# Patient Record
Sex: Male | Born: 1965 | Race: White | Hispanic: No | Marital: Married | Smoking: Former smoker
Health system: Southern US, Community
[De-identification: ages and names within clinical notes are randomized; demographics above are authoritative.]

## PROBLEM LIST (undated history)

## (undated) DIAGNOSIS — E119 Type 2 diabetes mellitus without complications: Secondary | ICD-10-CM

## (undated) DIAGNOSIS — E785 Hyperlipidemia, unspecified: Secondary | ICD-10-CM

## (undated) DIAGNOSIS — G4700 Insomnia, unspecified: Secondary | ICD-10-CM

## (undated) DIAGNOSIS — N200 Calculus of kidney: Secondary | ICD-10-CM

## (undated) DIAGNOSIS — F329 Major depressive disorder, single episode, unspecified: Secondary | ICD-10-CM

## (undated) DIAGNOSIS — T7840XA Allergy, unspecified, initial encounter: Secondary | ICD-10-CM

## (undated) HISTORY — DX: Allergy, unspecified, initial encounter: T78.40XA

## (undated) HISTORY — DX: Major depressive disorder, single episode, unspecified: F32.9

## (undated) HISTORY — DX: Hyperlipidemia, unspecified: E78.5

## (undated) HISTORY — DX: Insomnia, unspecified: G47.00

## (undated) HISTORY — DX: Type 2 diabetes mellitus without complications: E11.9

## (undated) HISTORY — PX: HERNIA REPAIR: SHX51

## (undated) HISTORY — PX: CHOLECYSTECTOMY: SHX55

## (undated) HISTORY — DX: Calculus of kidney: N20.0

---

## 1997-11-21 ENCOUNTER — Ambulatory Visit (HOSPITAL_COMMUNITY): Admission: RE | Admit: 1997-11-21 | Discharge: 1997-11-21 | Payer: Self-pay | Admitting: Surgery

## 1998-01-13 ENCOUNTER — Emergency Department (HOSPITAL_COMMUNITY): Admission: EM | Admit: 1998-01-13 | Discharge: 1998-01-13 | Payer: Self-pay | Admitting: Emergency Medicine

## 1998-01-13 ENCOUNTER — Encounter: Payer: Self-pay | Admitting: Emergency Medicine

## 1998-01-14 ENCOUNTER — Encounter: Payer: Self-pay | Admitting: Emergency Medicine

## 1998-01-14 ENCOUNTER — Ambulatory Visit (HOSPITAL_COMMUNITY): Admission: RE | Admit: 1998-01-14 | Discharge: 1998-01-14 | Payer: Self-pay | Admitting: Emergency Medicine

## 1998-01-16 ENCOUNTER — Observation Stay (HOSPITAL_COMMUNITY): Admission: RE | Admit: 1998-01-16 | Discharge: 1998-01-17 | Payer: Self-pay

## 1999-11-05 ENCOUNTER — Ambulatory Visit (HOSPITAL_COMMUNITY): Admission: RE | Admit: 1999-11-05 | Discharge: 1999-11-05 | Payer: Self-pay | Admitting: *Deleted

## 1999-11-13 ENCOUNTER — Encounter: Payer: Self-pay | Admitting: Urology

## 1999-11-13 ENCOUNTER — Ambulatory Visit (HOSPITAL_COMMUNITY): Admission: RE | Admit: 1999-11-13 | Discharge: 1999-11-13 | Payer: Self-pay | Admitting: Urology

## 1999-11-24 ENCOUNTER — Encounter: Payer: Self-pay | Admitting: Urology

## 1999-11-24 ENCOUNTER — Ambulatory Visit (HOSPITAL_COMMUNITY): Admission: RE | Admit: 1999-11-24 | Discharge: 1999-11-24 | Payer: Self-pay | Admitting: Urology

## 2005-12-04 ENCOUNTER — Emergency Department (HOSPITAL_COMMUNITY): Admission: EM | Admit: 2005-12-04 | Discharge: 2005-12-05 | Payer: Self-pay | Admitting: Emergency Medicine

## 2007-01-06 DIAGNOSIS — F32A Depression, unspecified: Secondary | ICD-10-CM

## 2007-01-06 HISTORY — DX: Depression, unspecified: F32.A

## 2009-07-04 ENCOUNTER — Ambulatory Visit (HOSPITAL_COMMUNITY): Admission: RE | Admit: 2009-07-04 | Discharge: 2009-07-04 | Payer: Self-pay | Admitting: Urology

## 2009-12-12 ENCOUNTER — Emergency Department (HOSPITAL_COMMUNITY): Admission: EM | Admit: 2009-12-12 | Discharge: 2009-06-30 | Payer: Self-pay | Admitting: Emergency Medicine

## 2010-03-23 LAB — URINALYSIS, ROUTINE W REFLEX MICROSCOPIC
Bilirubin Urine: NEGATIVE
Glucose, UA: NEGATIVE mg/dL
Ketones, ur: NEGATIVE mg/dL
Urobilinogen, UA: 0.2 mg/dL (ref 0.0–1.0)

## 2010-03-23 LAB — URINE MICROSCOPIC-ADD ON

## 2010-03-23 LAB — URINE CULTURE: Culture: NO GROWTH

## 2010-03-23 LAB — GLUCOSE, CAPILLARY: Glucose-Capillary: 108 mg/dL — ABNORMAL HIGH (ref 70–99)

## 2011-02-27 ENCOUNTER — Other Ambulatory Visit: Payer: Self-pay | Admitting: Family Medicine

## 2011-03-09 ENCOUNTER — Other Ambulatory Visit: Payer: Self-pay | Admitting: Internal Medicine

## 2011-03-09 MED ORDER — TEMAZEPAM 15 MG PO CAPS: 15.0000 mg | ORAL_CAPSULE | Freq: Every evening | ORAL | Status: DC | PRN

## 2011-03-10 ENCOUNTER — Other Ambulatory Visit: Payer: Self-pay

## 2011-03-10 MED ORDER — TEMAZEPAM 15 MG PO CAPS: 15.0000 mg | ORAL_CAPSULE | Freq: Every evening | ORAL | Status: DC | PRN

## 2011-03-18 ENCOUNTER — Other Ambulatory Visit: Payer: Self-pay | Admitting: Physician Assistant

## 2011-03-19 ENCOUNTER — Other Ambulatory Visit: Payer: Self-pay

## 2011-03-30 ENCOUNTER — Encounter: Payer: Self-pay | Admitting: Family Medicine

## 2011-03-30 ENCOUNTER — Ambulatory Visit (INDEPENDENT_AMBULATORY_CARE_PROVIDER_SITE_OTHER): Payer: 59 | Admitting: Family Medicine

## 2011-03-30 VITALS — BP 126/71 | HR 86 | Temp 97.7°F | Resp 16 | Ht 67.0 in | Wt 191.0 lb

## 2011-03-30 DIAGNOSIS — E785 Hyperlipidemia, unspecified: Secondary | ICD-10-CM

## 2011-03-30 DIAGNOSIS — I10 Essential (primary) hypertension: Secondary | ICD-10-CM | POA: Insufficient documentation

## 2011-03-30 DIAGNOSIS — E119 Type 2 diabetes mellitus without complications: Secondary | ICD-10-CM | POA: Insufficient documentation

## 2011-03-30 DIAGNOSIS — F329 Major depressive disorder, single episode, unspecified: Secondary | ICD-10-CM

## 2011-03-30 LAB — POCT GLYCOSYLATED HEMOGLOBIN (HGB A1C): Hemoglobin A1C: 5.2

## 2011-03-30 MED ORDER — CITALOPRAM HYDROBROMIDE 10 MG PO TABS: 10.0000 mg | ORAL_TABLET | Freq: Every day | ORAL | Status: DC

## 2011-03-30 MED ORDER — METFORMIN HCL ER 500 MG PO TB24: ORAL_TABLET | ORAL | Status: DC

## 2011-03-30 MED ORDER — LISINOPRIL 5 MG PO TABS: 5.0000 mg | ORAL_TABLET | Freq: Every day | ORAL | Status: DC

## 2011-03-30 MED ORDER — ROSUVASTATIN CALCIUM 10 MG PO TABS: 10.0000 mg | ORAL_TABLET | Freq: Every day | ORAL | Status: DC

## 2011-03-30 MED ORDER — TEMAZEPAM 15 MG PO CAPS: 15.0000 mg | ORAL_CAPSULE | Freq: Every evening | ORAL | Status: AC | PRN

## 2011-03-30 NOTE — Progress Notes (Signed)
  Subjective:    Patient ID: Christian Myers, male    DOB: 10/11/1965, 46 y.o.   MRN: [id number]  Diabetes He presents for his follow-up diabetic visit. He has type 2 diabetes mellitus. No MedicAlert identification noted. His disease course has been stable. There are no hypoglycemic associated symptoms. Pertinent negatives for diabetes include no blurred vision, no chest pain, no foot ulcerations, no polydipsia, no polyphagia, no polyuria, no visual change, no weakness and no weight loss. Foot paresthesias: ocasionally cold. There are no hypoglycemic complications. Symptoms are stable. There are no diabetic complications. Risk factors for coronary artery disease include diabetes mellitus, dyslipidemia, male sex, family history, hypertension and stress. Current diabetic treatment includes oral agent (monotherapy). He is compliant with treatment all of the time. His weight is stable. He is following a diabetic diet. Meal planning includes carbohydrate counting. He has not had a previous visit with a dietician. He participates in exercise daily. There is no change in his home blood glucose trend. An ACE inhibitor/angiotensin II receptor blocker is being taken. Eye exam current: scheduled for dental and opthamology apt(Dr. Hazle Quant) visit in May.      Review of Systems  Constitutional: Negative for weight loss.  Eyes: Negative for blurred vision.  Cardiovascular: Negative for chest pain.  Genitourinary: Negative for polyuria.  Neurological: Negative for weakness.  Hematological: Negative for polydipsia and polyphagia.       Objective:   Physical Exam  Constitutional: He appears well-developed and well-nourished.  HENT:  Head: Normocephalic and atraumatic.  Right Ear: External ear normal.  Left Ear: External ear normal.  Nose: Nose normal.  Mouth/Throat: Oropharynx is clear and moist.  Neck: Neck supple. No thyromegaly present.  Cardiovascular: Normal rate, regular rhythm, normal heart sounds  and intact distal pulses.   Pulmonary/Chest: Effort normal and breath sounds normal.  Abdominal: Soft. Bowel sounds are normal. There is no hepatosplenomegaly.  Neurological: No sensory deficit (no evidence of stocking neuropathy).     Results for orders placed in visit on 03/30/11  POCT GLYCOSYLATED HEMOGLOBIN (HGB A1C)      Component Value Range   Hemoglobin A1C 5.2          Assessment & Plan:   1. Hyperlipidemia    2. Type 2 diabetes mellitus  POCT glycosylated hemoglobin (Hb A1C)  3. Depression     All medications refilled for 1 year Exercise encouraged Written prescription provided for 1 year of bayer contour test strips and lancets.   Follow up is in three months and at next OV patient will need A1C, CMET, Lipid and urine microalbumin.

## 2011-04-12 ENCOUNTER — Other Ambulatory Visit: Payer: Self-pay | Admitting: Physician Assistant

## 2011-06-29 ENCOUNTER — Ambulatory Visit: Payer: 59 | Admitting: Family Medicine

## 2011-07-18 ENCOUNTER — Other Ambulatory Visit: Payer: Self-pay | Admitting: Family Medicine

## 2011-07-29 IMAGING — CT CT ABD-PELV W/O CM
2 of 4 series · 17 of 46 positions shown, 19 images · non-contrast
Comparison: 12/04/2005

CLINICAL DATA: Flank pain

CT ABDOMEN AND PELVIS WITHOUT CONTRAST
TECHNIQUE: Multidetector CT imaging of the abdomen and pelvis was
performed following the standard protocol without intravenous
contrast.

[Series 2: stone_wo 5.0 b40f st · axial · 0.79mm/px · z∈[-504,-64]mm · 14 of 96 slices shown, 16 images]
[im 4/96  soft-tissue]
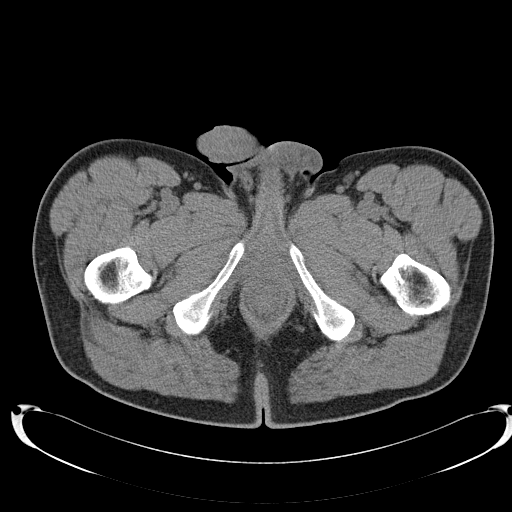
[im 4/96  bone]
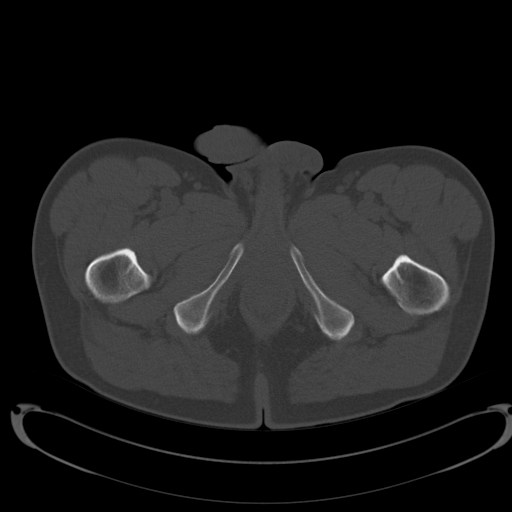
[im 12/96  soft-tissue]
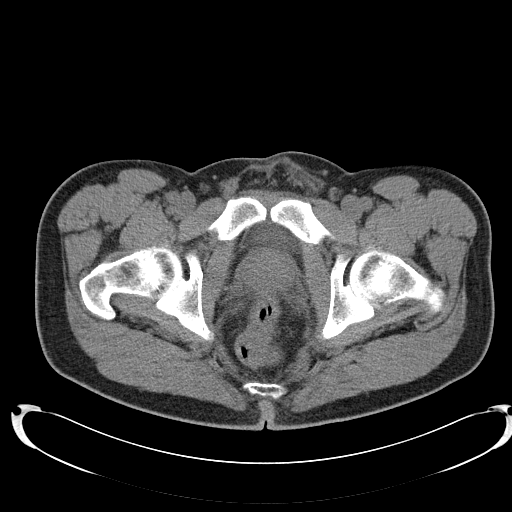
[im 20/96  soft-tissue]
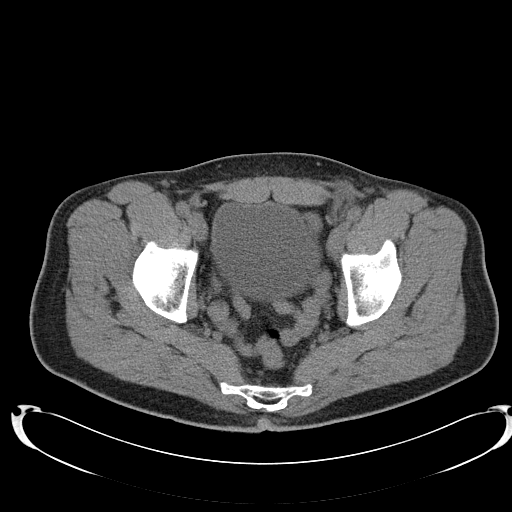
[im 24/96  soft-tissue]
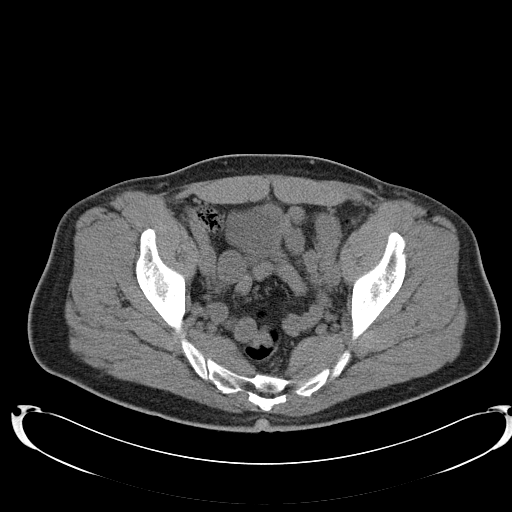
[im 32/96  soft-tissue]
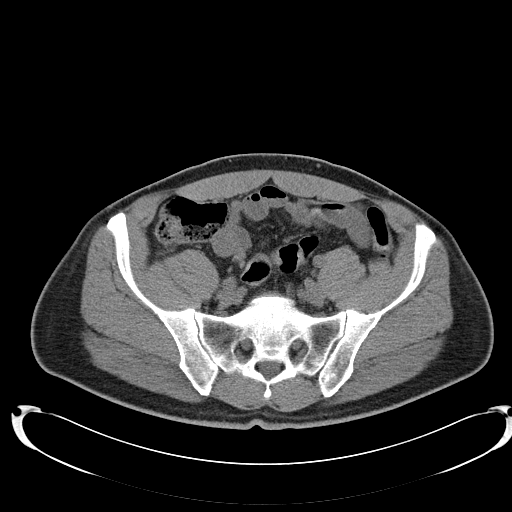
[im 40/96  soft-tissue]
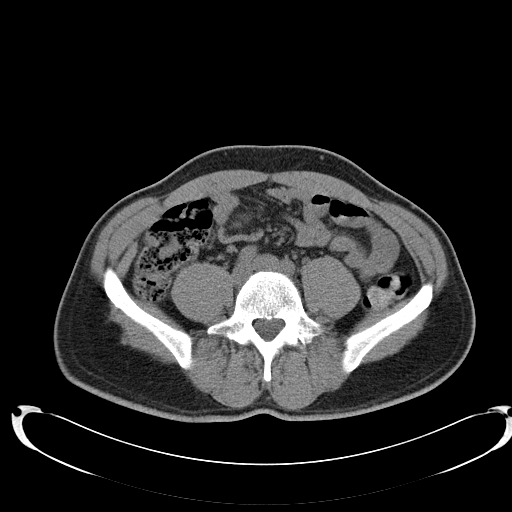
[im 44/96  soft-tissue]
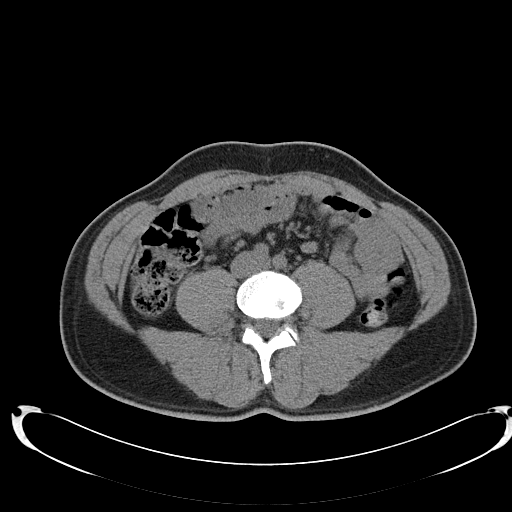
[im 52/96  soft-tissue]
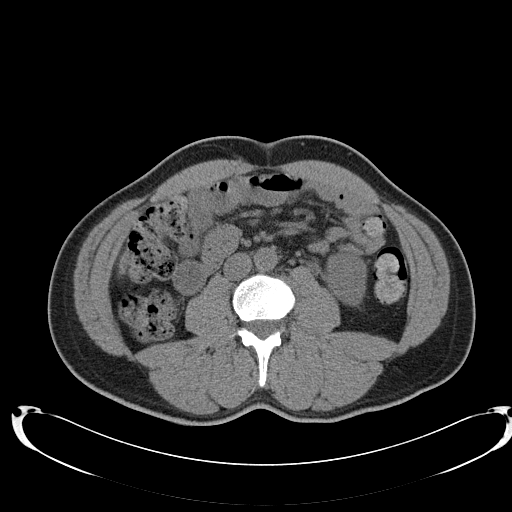
[im 56/96  soft-tissue]
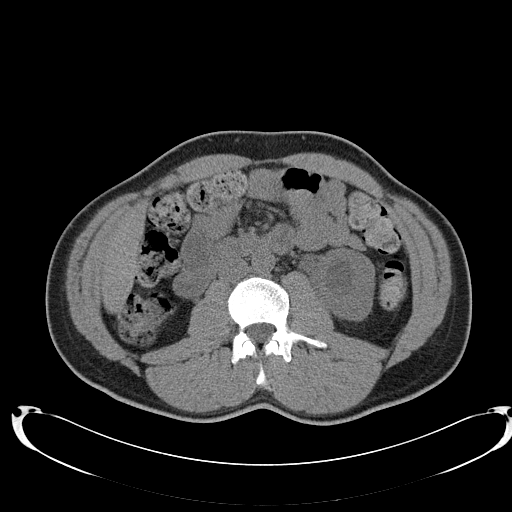
[im 56/96  bone]
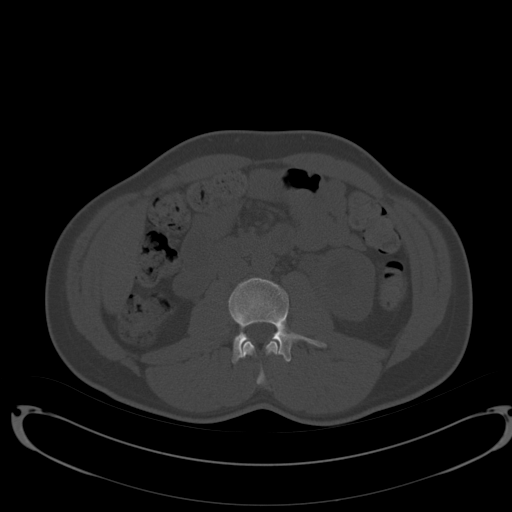
[im 64/96  soft-tissue]
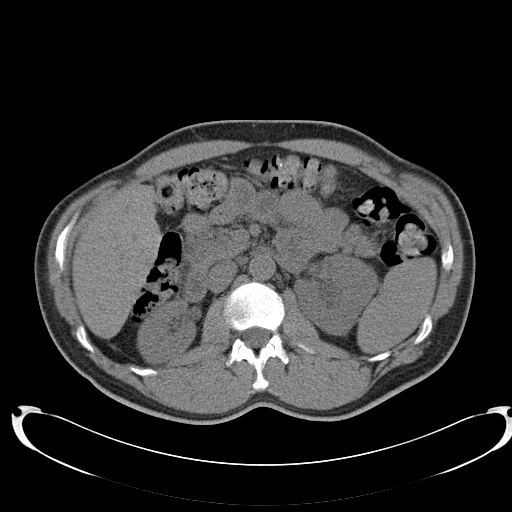
[im 72/96  soft-tissue]
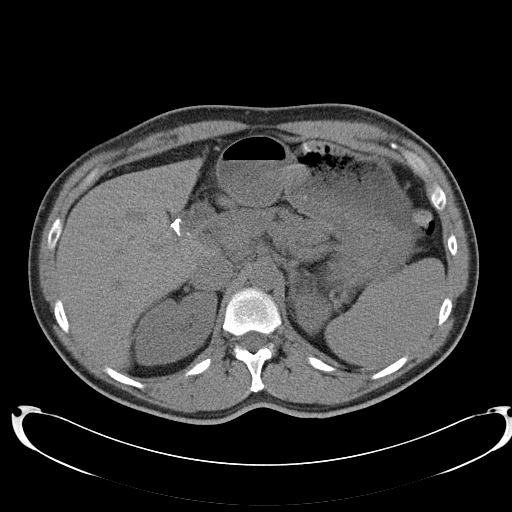
[im 76/96  soft-tissue]
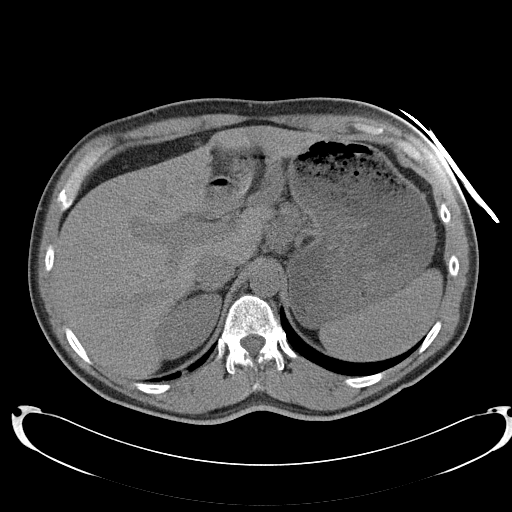
[im 84/96  soft-tissue]
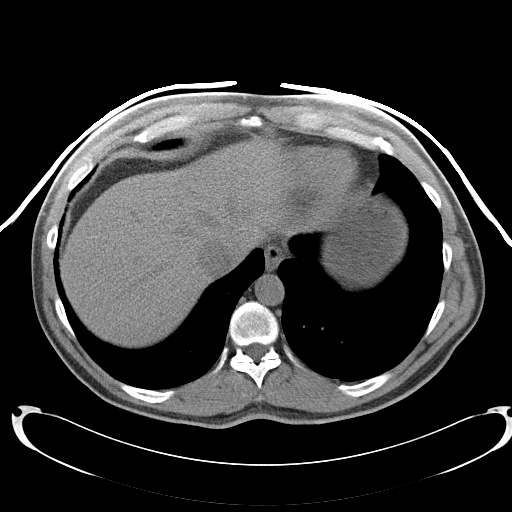
[im 92/96  soft-tissue]
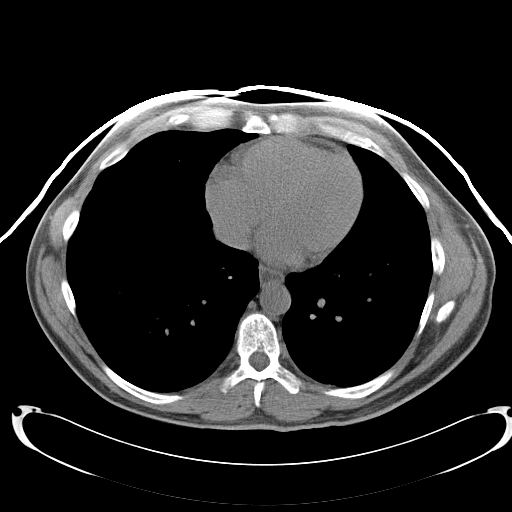

[Series 602: coronal abdomen · coronal · 0.97mm/px · 3 of 130 slices shown]
[im 44/130  soft-tissue]
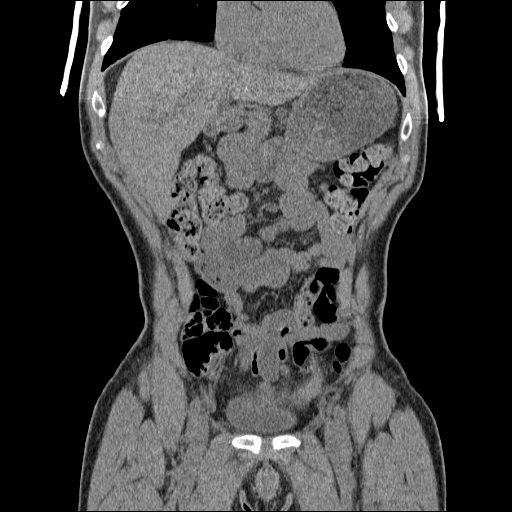
[im 58/130  soft-tissue]
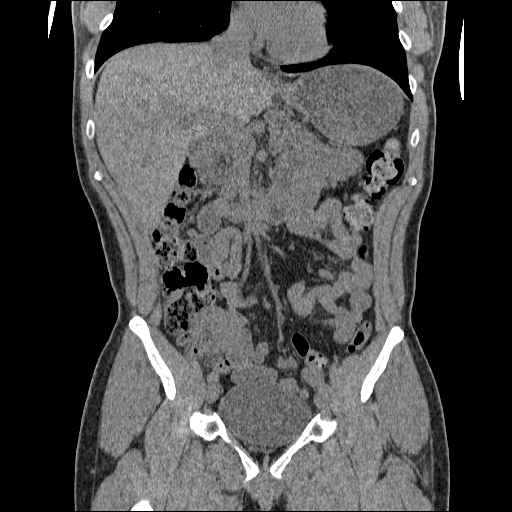
[im 72/130  soft-tissue]
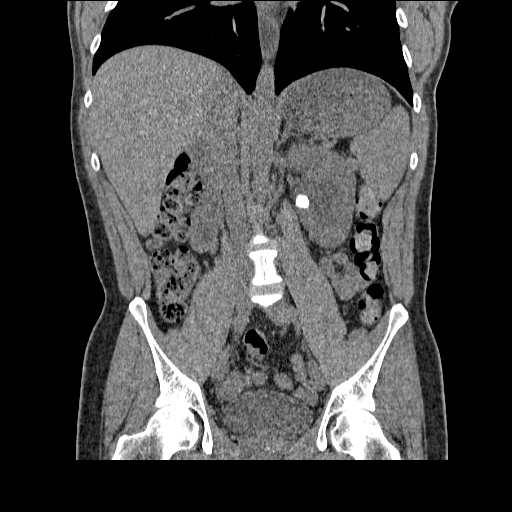

[17 of 46 positions shown; findings below may reference images not displayed]

FINDINGS: Lung bases are clear.

The spleen appears normal.

Status post cholecystectomy.

The liver parenchyma appears normal.

No significant biliary ductal dilatation.

Pancreas appears normal.  There is marked left-sided
hydronephrosis.  Large, obstructing stone within the left renal
pelvis measures 1.5 x 1.0 by a 1.5 cm.

Punctate nonobstructing stone is seen within the inferior pole of
the left kidney measuring 2-3 mm.

No right-sided hydronephrosis or nephrolithiasis.

The bowel loops of the upper abdomen have a normal course and
caliber without evidence for obstruction.

There is no free fluid or abnormal fluid collections identified
within the abdomen or pelvis.

The urinary bladder is normal.

No enlarged pelvic or inguinal lymph nodes.

Review of visualized osseous structures is unremarkable.
IMPRESSION: 1.  Large calculus within the left renal pelvis measures 1.5 cm and
results in left-sided hydronephrosis.

## 2011-10-04 ENCOUNTER — Other Ambulatory Visit: Payer: Self-pay | Admitting: *Deleted

## 2011-10-04 MED ORDER — TEMAZEPAM 15 MG PO CAPS: 15.0000 mg | ORAL_CAPSULE | Freq: Every evening | ORAL | Status: DC | PRN

## 2011-10-12 ENCOUNTER — Other Ambulatory Visit: Payer: Self-pay | Admitting: Physician Assistant

## 2011-11-08 ENCOUNTER — Ambulatory Visit (INDEPENDENT_AMBULATORY_CARE_PROVIDER_SITE_OTHER): Payer: 59 | Admitting: Internal Medicine

## 2011-11-08 VITALS — BP 124/71 | HR 71 | Temp 97.8°F | Resp 16 | Ht 67.5 in | Wt 189.2 lb

## 2011-11-08 DIAGNOSIS — F32A Depression, unspecified: Secondary | ICD-10-CM

## 2011-11-08 DIAGNOSIS — F329 Major depressive disorder, single episode, unspecified: Secondary | ICD-10-CM

## 2011-11-08 DIAGNOSIS — E119 Type 2 diabetes mellitus without complications: Secondary | ICD-10-CM

## 2011-11-08 DIAGNOSIS — E785 Hyperlipidemia, unspecified: Secondary | ICD-10-CM

## 2011-11-08 DIAGNOSIS — Z23 Encounter for immunization: Secondary | ICD-10-CM

## 2011-11-08 DIAGNOSIS — G4700 Insomnia, unspecified: Secondary | ICD-10-CM

## 2011-11-08 DIAGNOSIS — I10 Essential (primary) hypertension: Secondary | ICD-10-CM

## 2011-11-08 LAB — COMPREHENSIVE METABOLIC PANEL
ALT: 16 U/L (ref 0–53)
Albumin: 4.6 g/dL (ref 3.5–5.2)
Alkaline Phosphatase: 44 U/L (ref 39–117)
Creat: 1.14 mg/dL (ref 0.50–1.35)
Potassium: 4.4 mEq/L (ref 3.5–5.3)
Sodium: 136 mEq/L (ref 135–145)

## 2011-11-08 LAB — POCT CBC
Hemoglobin: 15 g/dL (ref 14.1–18.1)
MCHC: 31 g/dL — AB (ref 31.8–35.4)
MCV: 92.5 fL (ref 80–97)
MID (cbc): 0.4 (ref 0–0.9)
POC LYMPH PERCENT: 36.9 %L (ref 10–50)
RBC: 5.23 M/uL (ref 4.69–6.13)
RDW, POC: 12.1 %

## 2011-11-08 LAB — LIPID PANEL
Cholesterol: 161 mg/dL (ref 0–200)
Triglycerides: 143 mg/dL (ref <150)
VLDL: 29 mg/dL (ref 0–40)

## 2011-11-08 LAB — POCT GLYCOSYLATED HEMOGLOBIN (HGB A1C): Hemoglobin A1C: 5.3

## 2011-11-08 MED ORDER — ROSUVASTATIN CALCIUM 10 MG PO TABS: 10.0000 mg | ORAL_TABLET | Freq: Every day | ORAL | Status: AC

## 2011-11-08 MED ORDER — LISINOPRIL 5 MG PO TABS: 5.0000 mg | ORAL_TABLET | Freq: Every day | ORAL | Status: DC

## 2011-11-08 MED ORDER — METFORMIN HCL ER 500 MG PO TB24: ORAL_TABLET | ORAL | Status: DC

## 2011-11-08 MED ORDER — CITALOPRAM HYDROBROMIDE 10 MG PO TABS: 10.0000 mg | ORAL_TABLET | Freq: Every day | ORAL | Status: DC

## 2011-11-08 MED ORDER — TEMAZEPAM 15 MG PO CAPS: 15.0000 mg | ORAL_CAPSULE | Freq: Every evening | ORAL | Status: DC | PRN

## 2011-11-08 NOTE — Progress Notes (Signed)
Subjective:    Patient ID: Christian Myers, male    DOB: 1966/01/01, 46 y.o.   MRN: [id number]  HPIF/U doing well Spread sheet documents good control Needs labs/med refills Herbert Pun working Needs flu vacc   Past Medical History  Diagnosis Date  . Hyperlipidemia   . Type 2 diabetes mellitus   . Depression - well controlled     -  Insomnia   - HTN No past surgical history on file.  Prior to Admission medications   Medication Sig Start Date End Date Taking? Authorizing Provider  cholecalciferol (VITAMIN D) 400 UNITS TABS Take 1,000 Units by mouth once.   Yes Historical Provider, MD  citalopram (CELEXA) 10 MG tablet Take 1 tablet (10 mg total) by mouth daily. 03/30/11  Yes Dois Davenport, MD  lisinopril (PRINIVIL,ZESTRIL) 5 MG tablet TAKE ONE TABLET BY MOUTH ONE TIME DAILY 10/12/11  Yes Ryan M Dunn, PA-C  metFORMIN (GLUCOPHAGE-XR) 500 MG 24 hr tablet One daily 03/30/11  Yes Dois Davenport, MD  Multiple Vitamin (MULTIVITAMIN) tablet Take 1 tablet by mouth daily.   Yes Historical Provider, MD  rosuvastatin (CRESTOR) 10 MG tablet Take 1 tablet (10 mg total) by mouth daily. 03/30/11  Yes Dois Davenport, MD  temazepam (RESTORIL) 15 MG capsule Take 1 capsule (15 mg total) by mouth at bedtime as needed for sleep. Needs office visit before next refill 10/04/11  Yes Ryan M Dunn, PA-C  temazepam (RESTORIL) 15 MG capsule Take 1 capsule (15 mg total) by mouth at bedtime as needed for sleep. 03/30/11 04/29/11  Dois Davenport, MD    Allergies  Allergen Reactions  . Codeine     History   Social History  . Marital Status: Married    Spouse Name: N/A    Number of Children: N/A  . Years of Education: N/A   Occupational History  . Not on file.   Social History Main Topics  . Smoking status: Former Smoker      No family history on file.    Review of Systems Denies chest pain, shortness of breath, HA, dizziness, vision change, nausea, vomiting, diarrhea, constipation, melena,  hematochezia, dysuria, increased urinary urgency or frequency, increased hunger or thirst, unintentional weight change, unexplained myalgias or arthralgias, rash.     Objective:   Physical Exam General: 45 yo M presents w/wife for exam he appears well organized and cooperative w/exam. Vitals:  Filed Vitals:   11/08/11 1042  BP: 124/71  Pulse: 71  Temp: 97.8 F (36.6 C)  Resp: 16  HEENT: Nontraumatic, EOMIT, Normal to external exam, trachea midline Heart: RRR Lungs: CTA bilaterally Abdomen: Flat, active bs, nt to palpation w/o rebound MSK: Normal bulk and tone Neuro: Alert, oriented, CN II  - XII IT  Labs:    Results for orders placed in visit on 11/08/11  POCT CBC      Component Value Range   WBC 5.8  4.6 - 10.2 K/uL   Lymph, poc 2.1  0.6 - 3.4   POC LYMPH PERCENT 36.9  10 - 50 %L   MID (cbc) 0.4  0 - 0.9   POC MID % 6.8  0 - 12 %M   POC Granulocyte 3.3  2 - 6.9   Granulocyte percent 56.3  37 - 80 %G   RBC 5.23  4.69 - 6.13 M/uL   Hemoglobin 15.0  14.1 - 18.1 g/dL   HCT, POC 40.9  81.1 - 53.7 %   MCV 92.5  80 -  97 fL   MCH, POC 28.7  27 - 31.2 pg   MCHC 31.0 (*) 31.8 - 35.4 g/dL   RDW, POC 96.0     Platelet Count, POC 231  142 - 424 K/uL   MPV 8.2  0 - 99.8 fL  POCT GLYCOSYLATED HEMOGLOBIN (HGB A1C)      Component Value Range   Hemoglobin A1C 5.3      Assessment & Plan:  1) DM -stable 2) Hyperlipidemia 3) Depression - under control 4) Insomnia - under control 5) Htn-stable  Meds ordered this encounter  Medications  . cholecalciferol (VITAMIN D) 400 UNITS TABS    Sig: Take 1,000 Units by mouth once.  . Multiple Vitamin (MULTIVITAMIN) tablet    Sig: Take 1 tablet by mouth daily.  Marland Kitchen lisinopril (PRINIVIL,ZESTRIL) 5 MG tablet    Sig: Take 1 tablet (5 mg total) by mouth daily.    Dispense:  90 tablet    Refill:  3  . metFORMIN (GLUCOPHAGE-XR) 500 MG 24 hr tablet    Sig: One daily    Dispense:  90 tablet    Refill:  3  . rosuvastatin (CRESTOR) 10 MG tablet     Sig: Take 1 tablet (10 mg total) by mouth daily.    Dispense:  90 tablet    Refill:  3  . temazepam (RESTORIL) 15 MG capsule    Sig: Take 1 capsule (15 mg total) by mouth at bedtime as needed for sleep.    Dispense:  30 capsule    Refill:  5  . citalopram (CELEXA) 10 MG tablet    Sig: Take 1 tablet (10 mg total) by mouth daily.    Dispense:  90 tablet    Refill:  3   Followup in 6 months for CPE/requesting to see Dr. Katrinka Blazing for primary care and Dr. Hal Hope has gone

## 2011-11-10 ENCOUNTER — Encounter: Payer: Self-pay | Admitting: Internal Medicine

## 2011-11-10 NOTE — Progress Notes (Signed)
Note to Dr. Katrinka Blazing to confirm if she will see this NP. Lennon Alstrom

## 2011-11-11 ENCOUNTER — Other Ambulatory Visit: Payer: Self-pay | Admitting: Radiology

## 2011-11-11 NOTE — Progress Notes (Signed)
Left msg for pt to call to schedule CPE--should be routed to Dr. Clelia Croft or Audria Nine as pt has not seen Dr. Katrinka Blazing. Lennon Alstrom

## 2011-11-16 NOTE — Progress Notes (Signed)
Sent pt a reminder letter to schedule CPE with Drs Clelia Croft or Audria Nine. Lennon Alstrom

## 2012-04-25 ENCOUNTER — Encounter: Payer: Self-pay | Admitting: Family Medicine

## 2012-04-25 ENCOUNTER — Ambulatory Visit (INDEPENDENT_AMBULATORY_CARE_PROVIDER_SITE_OTHER): Payer: 59 | Admitting: Family Medicine

## 2012-04-25 VITALS — BP 120/76 | HR 90 | Temp 98.1°F | Resp 16 | Ht 67.5 in | Wt 194.4 lb

## 2012-04-25 DIAGNOSIS — Z23 Encounter for immunization: Secondary | ICD-10-CM

## 2012-04-25 DIAGNOSIS — Z0000 Encounter for general adult medical examination without abnormal findings: Secondary | ICD-10-CM

## 2012-04-25 DIAGNOSIS — E119 Type 2 diabetes mellitus without complications: Secondary | ICD-10-CM

## 2012-04-25 LAB — CBC WITH DIFFERENTIAL/PLATELET
Basophils Absolute: 0 10*3/uL (ref 0.0–0.1)
Eosinophils Absolute: 0.2 10*3/uL (ref 0.0–0.7)
Hemoglobin: 15.3 g/dL (ref 13.0–17.0)
MCV: 84.5 fL (ref 78.0–100.0)
Monocytes Absolute: 0.4 10*3/uL (ref 0.1–1.0)
Monocytes Relative: 6 % (ref 3–12)
Neutro Abs: 4.6 10*3/uL (ref 1.7–7.7)
Neutrophils Relative %: 67 % (ref 43–77)
Platelets: 212 10*3/uL (ref 150–400)
RBC: 5.17 MIL/uL (ref 4.22–5.81)
RDW: 13 % (ref 11.5–15.5)
WBC: 6.9 10*3/uL (ref 4.0–10.5)

## 2012-04-25 LAB — LIPID PANEL
Cholesterol: 188 mg/dL (ref 0–200)
HDL: 47 mg/dL (ref >39)
LDL Cholesterol: 107 mg/dL — ABNORMAL HIGH (ref 0–99)
Total CHOL/HDL Ratio: 4 Ratio

## 2012-04-25 LAB — HEMOGLOBIN A1C: Hgb A1c MFr Bld: 5.3 % (ref <5.7)

## 2012-04-25 LAB — COMPREHENSIVE METABOLIC PANEL
AST: 23 U/L (ref 0–37)
Albumin: 4.7 g/dL (ref 3.5–5.2)
Alkaline Phosphatase: 46 U/L (ref 39–117)
BUN: 18 mg/dL (ref 6–23)
Calcium: 9.8 mg/dL (ref 8.4–10.5)
Chloride: 101 mEq/L (ref 96–112)
Creat: 1.19 mg/dL (ref 0.50–1.35)
Sodium: 139 mEq/L (ref 135–145)
Total Protein: 7.3 g/dL (ref 6.0–8.3)

## 2012-04-25 LAB — TSH: TSH: 1.85 u[IU]/mL (ref 0.350–4.500)

## 2012-04-25 NOTE — Progress Notes (Signed)
[address]   [place], [place]  [address]   [phone]  Subjective:    Patient ID: Christian Myers, male    DOB: [birth year], 47 y.o.   MRN: [id number]  HPI This 47 y.o. male presents for evaluation for CPE.   Last physical unknown. Colonoscopy never. Pneumovax 08/2007. TDAP 08/25/2005. Hepatitis B series never.  ?Single hepatitis shot age 33.   Flu vaccine fall 2013 [facility]. Eye exam [facility] 05/30/12 scheduled.  No diabetic retinopathy.  No glaucoma or cataracts.  Reading glasses. Dental exam every six months.   Review of Systems  Constitutional: Negative for fever, chills, diaphoresis, activity change, appetite change, fatigue and unexpected weight change.  HENT: Negative for hearing loss, ear pain, nosebleeds, congestion, sore throat, facial swelling, rhinorrhea, sneezing, drooling, mouth sores, trouble swallowing, neck pain, neck stiffness, dental problem, voice change, postnasal drip, sinus pressure, tinnitus and ear discharge.   Eyes: Negative for photophobia, pain, discharge, redness, itching and visual disturbance.  Respiratory: Negative for apnea, cough, choking, chest tightness, shortness of breath, wheezing and stridor.   Cardiovascular: Negative for chest pain, palpitations and leg swelling.  Gastrointestinal: Negative for nausea, vomiting, abdominal pain, diarrhea, constipation, blood in stool, abdominal distention, anal bleeding and rectal pain.  Endocrine: Negative for cold intolerance, heat intolerance, polydipsia, polyphagia and polyuria.  Genitourinary: Negative for dysuria, urgency, frequency, hematuria, flank pain, discharge, penile swelling, scrotal swelling, enuresis, difficulty urinating, genital sores, penile pain and testicular pain.  Musculoskeletal: Negative for myalgias, back pain, joint swelling, arthralgias and gait problem.  Skin: Negative for color change, pallor, rash and wound.  Allergic/Immunologic: Negative for environmental allergies, food allergies  and immunocompromised state.  Neurological: Negative for dizziness, tremors, seizures, syncope, facial asymmetry, speech difficulty, weakness, light-headedness, numbness and headaches.  Hematological: Negative for adenopathy. Does not bruise/bleed easily.  Psychiatric/Behavioral: Negative for suicidal ideas, hallucinations, behavioral problems, confusion, sleep disturbance, self-injury, dysphoric mood, decreased concentration and agitation. The patient is not nervous/anxious and is not hyperactive.        Objective:   Physical Exam  Nursing note and vitals reviewed. Constitutional: He is oriented to person, place, and time. He appears well-developed and well-nourished. No distress.  HENT:  Head: Normocephalic and atraumatic.  Right Ear: External ear normal.  Left Ear: External ear normal.  Nose: Nose normal.  Mouth/Throat: Oropharynx is clear and moist.  Eyes: Conjunctivae and EOM are normal. Pupils are equal, round, and reactive to light.  Neck: Normal range of motion. Neck supple. No JVD present. No thyromegaly present.  Cardiovascular: Normal rate, regular rhythm, normal heart sounds and intact distal pulses.  Exam reveals no gallop and no friction rub.   No murmur heard. Pulmonary/Chest: Effort normal and breath sounds normal. No respiratory distress. He has no wheezes. He has no rales.  Abdominal: Soft. Bowel sounds are normal. He exhibits no distension and no mass. There is no tenderness. There is no rebound and no guarding. Hernia confirmed negative in the right inguinal area and confirmed negative in the left inguinal area.  Genitourinary: Testes normal and penis normal.  Musculoskeletal: Normal range of motion.  Lymphadenopathy:    He has no cervical adenopathy.       Right: No inguinal adenopathy present.       Left: No inguinal adenopathy present.  Neurological: He is alert and oriented to person, place, and time. He has normal reflexes. No cranial nerve deficit. He exhibits  normal muscle tone. Coordination normal.  Skin: Skin is warm and dry. No  rash noted. He is not diaphoretic. No erythema. No pallor.  Psychiatric: He has a normal mood and affect. His behavior is normal. Judgment and thought content normal.   EKG: NSR.  HEPATITIS B#1 ADMINISTERED.    Assessment & Plan:  Routine general medical examination at a health care facility - Plan: CBC with Differential, Comprehensive metabolic panel, Hemoglobin A1c, Lipid panel, TSH, Vitamin B12, Folate, Vitamin D 25 hydroxy, PSA, EKG 12-Lead, aspirin EC 81 MG tablet, CANCELED: POCT urinalysis dipstick, CANCELED: Microalbumin, urine  Need for prophylactic vaccination and inoculation against viral hepatitis - Plan: Hepatitis B vaccine adult IM

## 2012-04-25 NOTE — Progress Notes (Signed)
  Subjective:    Patient ID: Christian Myers, male    DOB: 07-04-1965, 47 y.o.   MRN: [id number]  HPI    Review of Systems  Constitutional: Negative.   HENT: Negative.   Eyes: Negative.   Respiratory: Negative.   Cardiovascular: Negative.   Gastrointestinal: Negative.   Endocrine: Negative.   Genitourinary: Negative.   Musculoskeletal: Negative.   Skin: Negative.   Allergic/Immunologic: Negative.   Neurological: Negative.   Hematological: Negative.   Psychiatric/Behavioral: Negative.        Objective:   Physical Exam        Assessment & Plan:

## 2012-04-26 ENCOUNTER — Telehealth: Payer: Self-pay

## 2012-04-26 LAB — PSA: PSA: 1.02 ng/mL (ref <=4.00)

## 2012-04-26 MED ORDER — LISINOPRIL 5 MG PO TABS: 5.0000 mg | ORAL_TABLET | Freq: Every day | ORAL | Status: AC

## 2012-04-26 MED ORDER — CITALOPRAM HYDROBROMIDE 10 MG PO TABS: 10.0000 mg | ORAL_TABLET | Freq: Every day | ORAL | Status: AC

## 2012-04-26 NOTE — Telephone Encounter (Signed)
[name] from [facility] called stating that the following medications  Citalopram and Lisinopri were not sent to them. Patient came in today looking for medication and was told they did not receive any of the prescriptions. Please send to [facility] @[address] Thanks

## 2012-04-26 NOTE — Telephone Encounter (Signed)
Was this supposed to be sent in, please advise.

## 2012-04-29 ENCOUNTER — Encounter: Payer: Self-pay | Admitting: Family Medicine

## 2012-05-09 ENCOUNTER — Other Ambulatory Visit: Payer: Self-pay | Admitting: Radiology

## 2012-05-09 MED ORDER — TEMAZEPAM 15 MG PO CAPS: 15.0000 mg | ORAL_CAPSULE | Freq: Every evening | ORAL | Status: DC | PRN

## 2012-05-09 NOTE — Telephone Encounter (Signed)
rec'd fax for Temazepam, please advise, pended

## 2012-05-11 ENCOUNTER — Other Ambulatory Visit: Payer: Self-pay

## 2012-05-11 MED ORDER — BLOOD GLUCOSE TEST VI STRP: ORAL_STRIP | Status: DC

## 2012-05-25 ENCOUNTER — Ambulatory Visit (INDEPENDENT_AMBULATORY_CARE_PROVIDER_SITE_OTHER): Payer: 59 | Admitting: Physician Assistant

## 2012-05-25 DIAGNOSIS — Z0000 Encounter for general adult medical examination without abnormal findings: Secondary | ICD-10-CM | POA: Insufficient documentation

## 2012-05-25 DIAGNOSIS — Z23 Encounter for immunization: Secondary | ICD-10-CM | POA: Insufficient documentation

## 2012-05-25 NOTE — Assessment & Plan Note (Signed)
Anticipatory guidance ---- ASA 81mg  once daily.  S/p hepatitis B vaccine #1; RTC one month for Hepatitis B#2.  Obtain labs.

## 2012-05-25 NOTE — Assessment & Plan Note (Signed)
S/p Hepatitis B#1 in office; RTC one month for #2.

## 2012-05-25 NOTE — Assessment & Plan Note (Signed)
Controlled; obtain labs.

## 2012-05-25 NOTE — Progress Notes (Signed)
Pt here for 2nd Hep B. Pt is on time. Next shot due 10/25/12

## 2012-07-18 ENCOUNTER — Other Ambulatory Visit: Payer: Self-pay

## 2012-07-18 MED ORDER — LANCETS MISC: Status: AC

## 2012-10-24 ENCOUNTER — Ambulatory Visit: Payer: 59 | Admitting: Family Medicine

## 2012-10-26 ENCOUNTER — Other Ambulatory Visit: Payer: Self-pay

## 2012-10-26 MED ORDER — TEMAZEPAM 15 MG PO CAPS: 15.0000 mg | ORAL_CAPSULE | Freq: Every evening | ORAL | Status: AC | PRN

## 2012-10-26 NOTE — Telephone Encounter (Signed)
Dr Elbert Ewings, pharm requests RF of Restoril. Called and pt got last of the previous Rx RFs on 09/26/12. It looks like pt is due for OV. I have pended 1 mos RF w/note that pt needs OV for more if you'd like to approve, or let me know if you want to deny.

## 2012-10-28 ENCOUNTER — Other Ambulatory Visit: Payer: Self-pay | Admitting: *Deleted

## 2012-10-31 ENCOUNTER — Ambulatory Visit: Payer: 59 | Admitting: Family Medicine

## 2012-11-10 ENCOUNTER — Other Ambulatory Visit: Payer: Self-pay

## 2013-01-01 ENCOUNTER — Other Ambulatory Visit: Payer: Self-pay | Admitting: Internal Medicine

## 2013-01-20 ENCOUNTER — Other Ambulatory Visit: Payer: Self-pay | Admitting: Family Medicine

## 2013-02-12 ENCOUNTER — Other Ambulatory Visit: Payer: Self-pay | Admitting: Physician Assistant

## 2013-03-13 ENCOUNTER — Other Ambulatory Visit: Payer: Self-pay | Admitting: Physician Assistant

## 2013-03-29 ENCOUNTER — Other Ambulatory Visit: Payer: Self-pay | Admitting: Physician Assistant

## 2013-05-30 ENCOUNTER — Ambulatory Visit (INDEPENDENT_AMBULATORY_CARE_PROVIDER_SITE_OTHER): Payer: 59 | Admitting: Emergency Medicine

## 2013-05-30 VITALS — BP 122/74 | HR 67 | Temp 97.9°F | Resp 16 | Ht 68.0 in | Wt 195.4 lb

## 2013-05-30 DIAGNOSIS — Z23 Encounter for immunization: Secondary | ICD-10-CM

## 2013-05-30 DIAGNOSIS — S61219A Laceration without foreign body of unspecified finger without damage to nail, initial encounter: Secondary | ICD-10-CM

## 2013-05-30 DIAGNOSIS — S61209A Unspecified open wound of unspecified finger without damage to nail, initial encounter: Secondary | ICD-10-CM

## 2013-05-30 NOTE — Progress Notes (Addendum)
   Subjective:    Patient ID: Adrith Ackerman, male    DOB: May 03, 1965, 48 y.o.   MRN: [id number]  HPI 48 yo male sliced finger tip of left index finger on box cutter just prior to arrival.  Washed with water, continued to bleed until arrival here.  Bleeding now controlled and stopped.  PPMH:  Hypertension, diabetes  SH:  Former smoker.   Review of Systems  Constitutional: Negative for fever, chills, activity change and fatigue.  Skin: Positive for wound.       Objective:   Physical Exam Blood pressure 122/74, pulse 67, temperature 97.9 F (36.6 C), temperature source Oral, resp. rate 16, height 5\' 8"  (1.727 m), weight 195 lb 6.4 oz (88.633 kg), SpO2 98.00%. Body mass index is 29.72 kg/(m^2). Well-developed, well nourished male who is awake, alert and oriented, in NAD. HEENT: [place]/AT, PERRL, EOMI.  Sclera and conjunctiva are clear.   Neck: FROM Lungs: normal effort, Extremities: Superficial nonbleeding fingertip avulsion left index finger with intact flap well approximated.   Skin: warm and dry without rash. Psychologic: good mood and appropriate affect, normal speech and behavior.     Assessment & Plan:  Finger tip laceration Tetanus updated today as it has been greater than 5 years.  Return as needed.  Bandage applied.  No need for suturing.  Had patient wash extensively with soap and water prior to dressing.  I have reviewed and agree with documentation. Robert P. Merla Riches, M.D.
